# Patient Record
Sex: Male | Born: 2005 | Race: White | Hispanic: Yes | Marital: Single
Health system: Southern US, Community
[De-identification: ages and names within clinical notes are randomized; demographics above are authoritative.]

## PROBLEM LIST (undated history)

## (undated) HISTORY — PX: SURGERY SCROTAL / TESTICULAR: SUR1316

---
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
## 2023-01-02 ENCOUNTER — Ambulatory Visit (HOSPITAL_COMMUNITY)
Admission: EM | Admit: 2023-01-02 | Discharge: 2023-01-02 | Disposition: A | Discharge status: Home / Self Care | Payer: BC Managed Care – PPO | Attending: Emergency Medicine | Admitting: Emergency Medicine

## 2023-01-02 DIAGNOSIS — Z203 Contact with and (suspected) exposure to rabies: Secondary | ICD-10-CM | POA: Diagnosis not present

## 2023-01-02 MED ORDER — RABIES VACCINE, PCEC IM SUSR
1.0000 mL | Freq: Once | INTRAMUSCULAR | Status: AC
  Administered 2023-01-02: 1 mL via INTRAMUSCULAR

## 2023-01-02 MED ORDER — RABIES VACCINE, PCEC IM SUSR
INTRAMUSCULAR | Status: AC
  Filled 2023-01-02: qty 1

## 2023-01-02 NOTE — ED Notes (Signed)
Pt came in for second rabies vaccine. Pt got vaccine in left deltoid. Pt tolerated well.

## 2023-01-02 NOTE — ED Triage Notes (Addendum)
Here for 2nd rabies fu.

## 2023-01-06 ENCOUNTER — Ambulatory Visit (HOSPITAL_COMMUNITY)
Admission: RE | Admit: 2023-01-06 | Discharge: 2023-01-06 | Disposition: A | Discharge status: Home / Self Care | Payer: BC Managed Care – PPO | Source: Ambulatory Visit | Attending: Emergency Medicine | Admitting: Emergency Medicine

## 2023-01-06 DIAGNOSIS — Z203 Contact with and (suspected) exposure to rabies: Secondary | ICD-10-CM | POA: Diagnosis not present

## 2023-01-06 MED ORDER — RABIES VACCINE, PCEC IM SUSR
INTRAMUSCULAR | Status: AC
  Filled 2023-01-06: qty 1

## 2023-01-06 MED ORDER — RABIES VACCINE, PCEC IM SUSR: 1.0000 mL | Freq: Once | INTRAMUSCULAR | Status: DC

## 2023-01-06 MED ORDER — RABIES VACCINE, PCEC IM SUSR
1.0000 mL | Freq: Once | INTRAMUSCULAR | Status: AC
  Administered 2023-01-06: 1 mL via INTRAMUSCULAR

## 2023-01-06 NOTE — ED Triage Notes (Signed)
Pt here for 3 rd rabies injection. Denies any problems or concerns.

## 2023-01-12 ENCOUNTER — Ambulatory Visit (HOSPITAL_COMMUNITY)
Admission: RE | Admit: 2023-01-12 | Discharge: 2023-01-12 | Disposition: A | Discharge status: Home / Self Care | Payer: BC Managed Care – PPO | Source: Ambulatory Visit | Attending: Urology | Admitting: Urology

## 2023-01-12 ENCOUNTER — Other Ambulatory Visit (HOSPITAL_COMMUNITY): Payer: Self-pay | Admitting: Urology

## 2023-01-12 DIAGNOSIS — Z8547 Personal history of malignant neoplasm of testis: Secondary | ICD-10-CM

## 2023-01-13 ENCOUNTER — Ambulatory Visit (HOSPITAL_COMMUNITY)
Admission: RE | Admit: 2023-01-13 | Discharge: 2023-01-13 | Disposition: A | Discharge status: Home / Self Care | Payer: BC Managed Care – PPO | Source: Ambulatory Visit | Attending: Internal Medicine | Admitting: Internal Medicine

## 2023-01-13 DIAGNOSIS — Z203 Contact with and (suspected) exposure to rabies: Secondary | ICD-10-CM

## 2023-01-13 MED ORDER — RABIES VACCINE, PCEC IM SUSR
1.0000 mL | Freq: Once | INTRAMUSCULAR | Status: AC
  Administered 2023-01-13: 1 mL via INTRAMUSCULAR

## 2023-01-13 MED ORDER — RABIES VACCINE, PCEC IM SUSR
INTRAMUSCULAR | Status: AC
  Filled 2023-01-13: qty 1

## 2023-01-13 NOTE — ED Triage Notes (Signed)
Pt here for 4th rabies injection 

## 2023-01-21 IMAGING — CR DG CHEST 2V
2 series · 2 of 2 positions shown · non-contrast
Comparison: CT scan of the chest July 28, 2021

CLINICAL DATA: History of malignant neoplasm of testis.  Follow-up.

EXAM:
CHEST - 2 VIEW

[w chest pa]
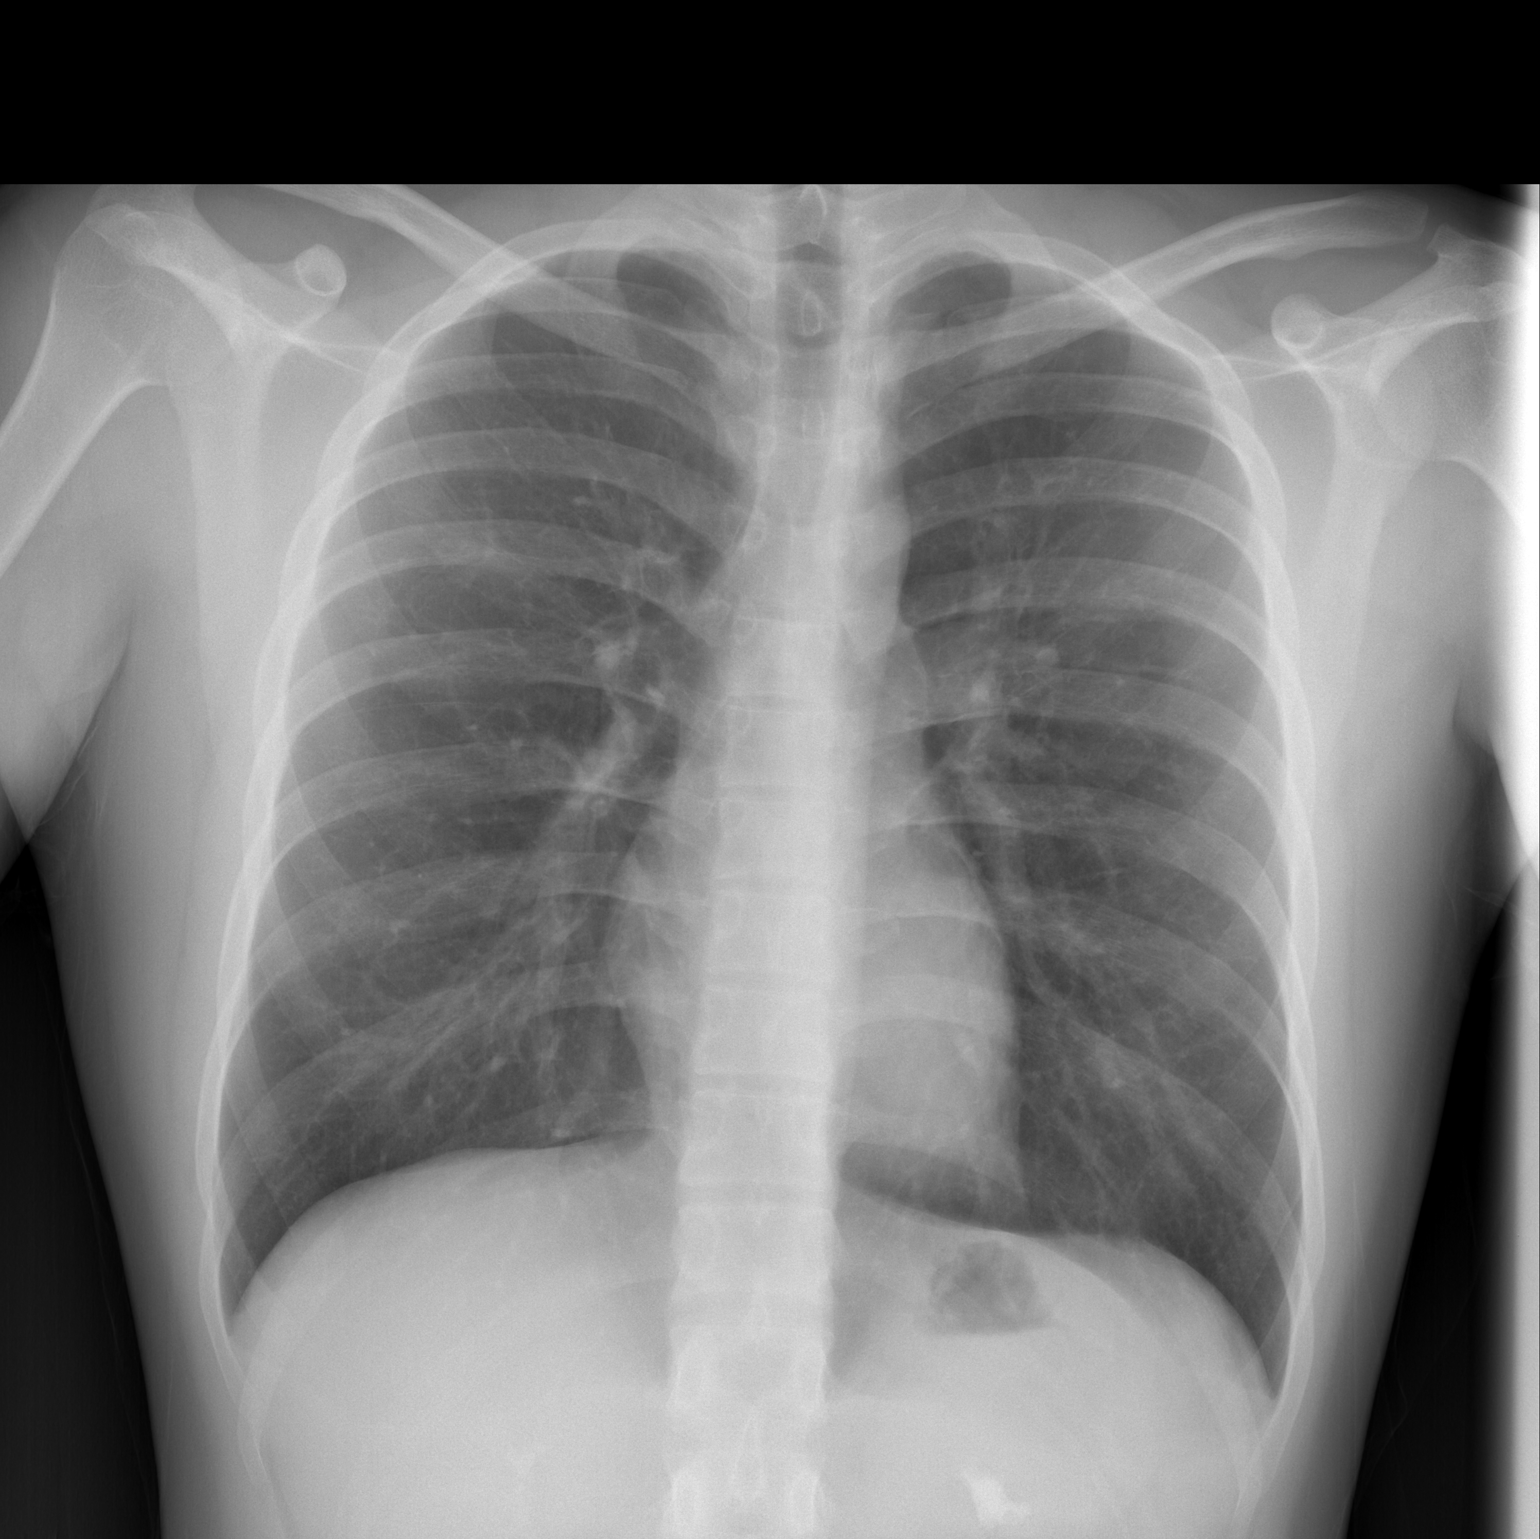

[w chest lat]
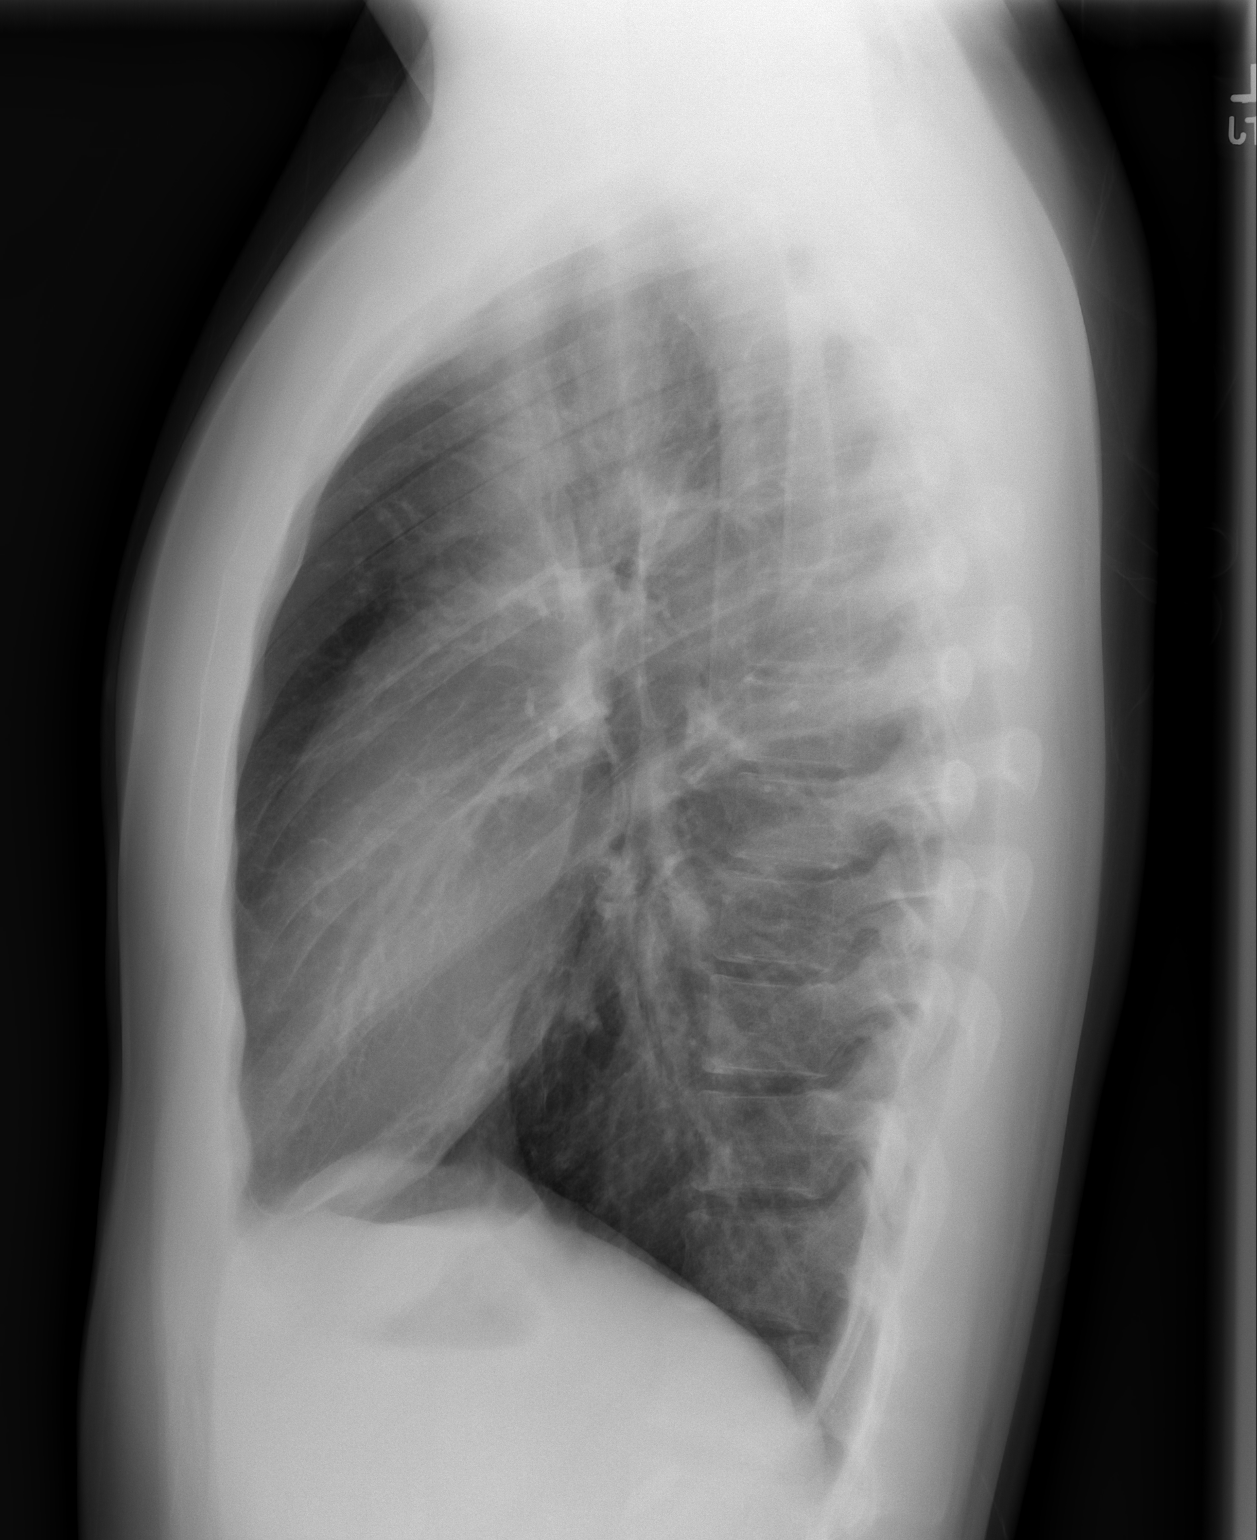

[2 of 2 positions shown; findings below may reference images not displayed]

FINDINGS: The heart size and mediastinal contours are within normal limits.
Both lungs are clear. The visualized skeletal structures are
unremarkable.
IMPRESSION: No active cardiopulmonary disease.

## 2023-07-12 ENCOUNTER — Ambulatory Visit (HOSPITAL_COMMUNITY)
Admission: RE | Admit: 2023-07-12 | Discharge: 2023-07-12 | Disposition: A | Discharge status: Home / Self Care | Payer: BC Managed Care – PPO | Source: Ambulatory Visit | Attending: Urology | Admitting: Urology

## 2023-07-12 ENCOUNTER — Other Ambulatory Visit (HOSPITAL_COMMUNITY): Payer: Self-pay | Admitting: Urology

## 2023-07-12 DIAGNOSIS — Z8547 Personal history of malignant neoplasm of testis: Secondary | ICD-10-CM
